# Patient Record
Sex: Male | Born: 1950 | Race: White | Hispanic: No | Marital: Married | Smoking: Never smoker
Health system: Southern US, Community
[De-identification: ages and names within clinical notes are randomized; demographics above are authoritative.]

## PROBLEM LIST (undated history)

## (undated) DIAGNOSIS — G4733 Obstructive sleep apnea (adult) (pediatric): Secondary | ICD-10-CM

## (undated) DIAGNOSIS — E8801 Alpha-1-antitrypsin deficiency: Secondary | ICD-10-CM

## (undated) DIAGNOSIS — J449 Chronic obstructive pulmonary disease, unspecified: Secondary | ICD-10-CM

## (undated) DIAGNOSIS — E781 Pure hyperglyceridemia: Secondary | ICD-10-CM

## (undated) DIAGNOSIS — K219 Gastro-esophageal reflux disease without esophagitis: Secondary | ICD-10-CM

## (undated) DIAGNOSIS — E039 Hypothyroidism, unspecified: Secondary | ICD-10-CM

## (undated) HISTORY — DX: Chronic obstructive pulmonary disease, unspecified: J44.9

## (undated) HISTORY — DX: Obstructive sleep apnea (adult) (pediatric): G47.33

## (undated) HISTORY — DX: Pure hyperglyceridemia: E78.1

## (undated) HISTORY — DX: Alpha-1-antitrypsin deficiency: E88.01

## (undated) HISTORY — DX: Gastro-esophageal reflux disease without esophagitis: K21.9

## (undated) HISTORY — DX: Hypothyroidism, unspecified: E03.9

---

## 2013-03-12 ENCOUNTER — Ambulatory Visit: Payer: Self-pay | Admitting: Gastroenterology

## 2016-03-01 DEATH — deceased

## 2016-03-20 ENCOUNTER — Other Ambulatory Visit: Payer: Self-pay | Admitting: Internal Medicine

## 2016-03-20 DIAGNOSIS — K921 Melena: Secondary | ICD-10-CM

## 2016-03-20 DIAGNOSIS — R197 Diarrhea, unspecified: Secondary | ICD-10-CM

## 2016-03-28 ENCOUNTER — Ambulatory Visit
Admission: RE | Admit: 2016-03-28 | Discharge: 2016-03-28 | Disposition: A | Discharge status: Home / Self Care | Payer: Commercial Managed Care - HMO | Source: Ambulatory Visit | Attending: Internal Medicine | Admitting: Internal Medicine

## 2016-03-28 DIAGNOSIS — R932 Abnormal findings on diagnostic imaging of liver and biliary tract: Secondary | ICD-10-CM | POA: Insufficient documentation

## 2016-03-28 DIAGNOSIS — K5730 Diverticulosis of large intestine without perforation or abscess without bleeding: Secondary | ICD-10-CM | POA: Insufficient documentation

## 2016-03-28 DIAGNOSIS — R197 Diarrhea, unspecified: Secondary | ICD-10-CM | POA: Diagnosis present

## 2016-03-28 DIAGNOSIS — K921 Melena: Secondary | ICD-10-CM

## 2016-03-28 DIAGNOSIS — M899 Disorder of bone, unspecified: Secondary | ICD-10-CM | POA: Diagnosis not present

## 2016-03-28 MED ORDER — IOPAMIDOL (ISOVUE-300) INJECTION 61%
100.0000 mL | Freq: Once | INTRAVENOUS | Status: AC | PRN
  Administered 2016-03-28: 100 mL via INTRAVENOUS

## 2017-08-21 ENCOUNTER — Other Ambulatory Visit: Payer: Self-pay | Admitting: Surgery

## 2017-08-21 DIAGNOSIS — M7521 Bicipital tendinitis, right shoulder: Secondary | ICD-10-CM

## 2017-08-29 ENCOUNTER — Ambulatory Visit
Admission: RE | Admit: 2017-08-29 | Discharge: 2017-08-29 | Disposition: A | Discharge status: Home / Self Care | Payer: Medicare Other | Source: Ambulatory Visit | Attending: Surgery | Admitting: Surgery

## 2017-08-29 DIAGNOSIS — M7551 Bursitis of right shoulder: Secondary | ICD-10-CM | POA: Diagnosis not present

## 2017-08-29 DIAGNOSIS — M19011 Primary osteoarthritis, right shoulder: Secondary | ICD-10-CM | POA: Diagnosis not present

## 2017-08-29 DIAGNOSIS — M7521 Bicipital tendinitis, right shoulder: Secondary | ICD-10-CM | POA: Diagnosis present

## 2018-10-12 IMAGING — MR MR SHOULDER*R* W/O CM
5 series · 40 of 40 positions shown · non-contrast
Comparison: None.

CLINICAL DATA: Chronic right shoulder pain (for 14 years) with
occasional clicking and popping. Worsens with repetitive motion. No
acute injury or prior relevant surgery.

EXAM:
MRI OF THE RIGHT SHOULDER WITHOUT CONTRAST
TECHNIQUE: Multiplanar, multisequence MR imaging of the shoulder was performed.
No intravenous contrast was administered.

[Series 3: T2 fat-sat · axial · 4.0mm · 0.59mm/px · z∈[-38,+59]mm · 8 of 23 slices shown (1 of 3)]
[im 1/23]
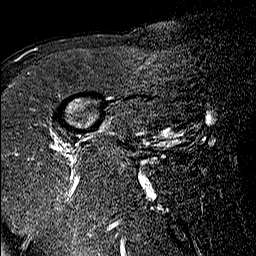
[im 4/23]
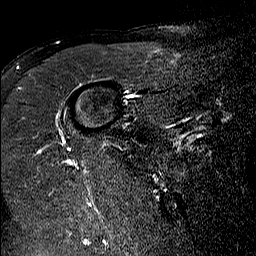
[im 7/23]
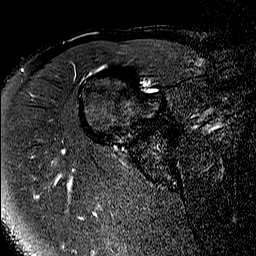
[im 10/23]
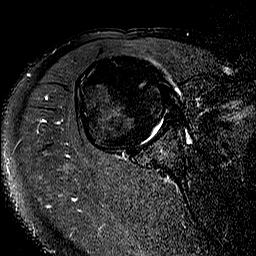
[im 13/23]
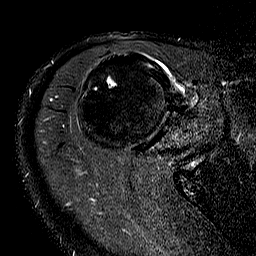
[im 16/23]
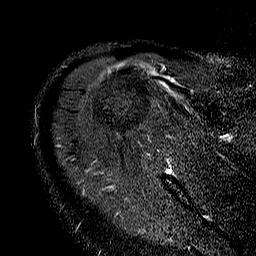
[im 19/23]
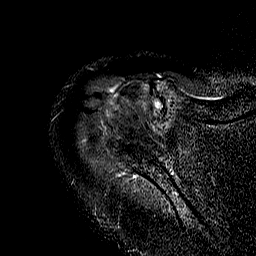
[im 23/23]
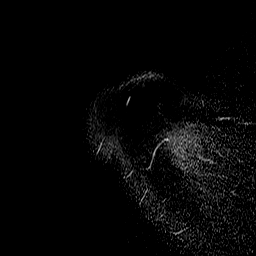

[Series 4: T2 fat-sat · oblique · 4.0mm · 0.59mm/px · 8 of 21 slices shown (2 of 3)]
[im 1/21]
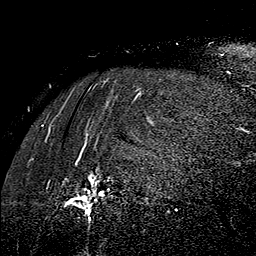
[im 3/21]
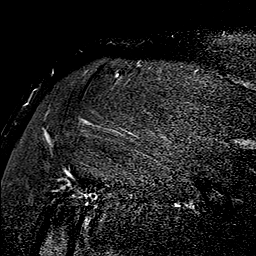
[im 6/21]
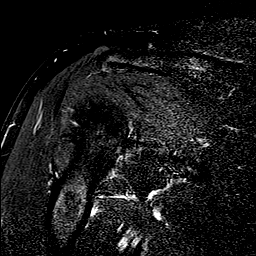
[im 9/21]
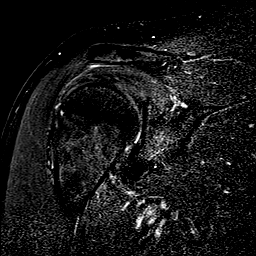
[im 12/21]
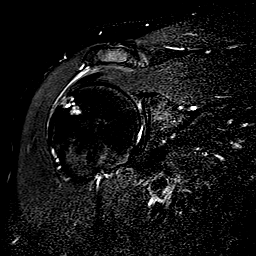
[im 15/21]
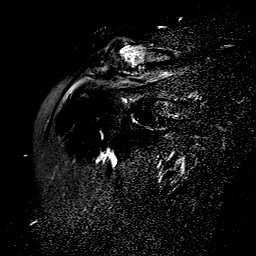
[im 18/21]
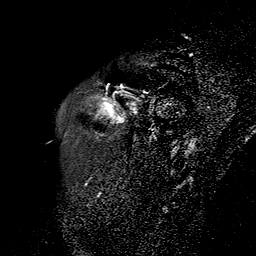
[im 21/21]
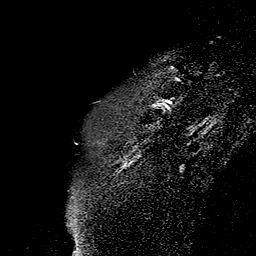

[Series 5: PD · oblique · 4.0mm · 0.59mm/px · 8 of 21 slices shown]
[im 1/21]
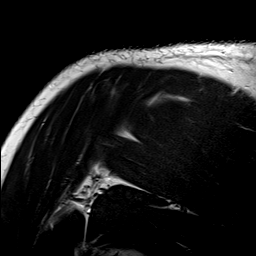
[im 3/21]
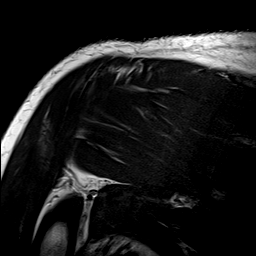
[im 6/21]
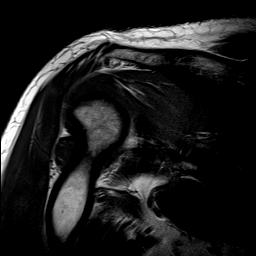
[im 9/21]
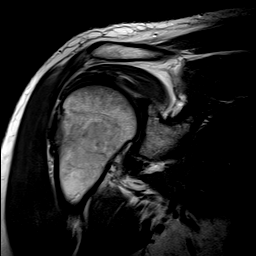
[im 12/21]
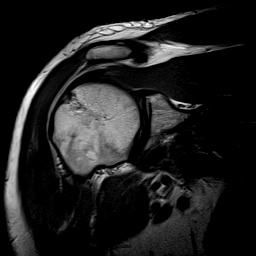
[im 15/21]
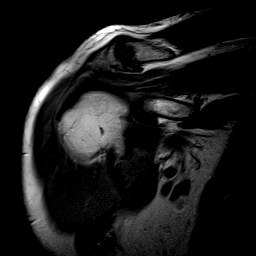
[im 18/21]
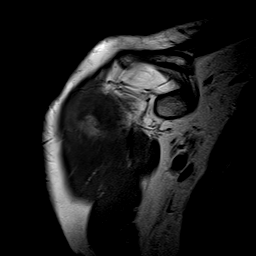
[im 21/21]
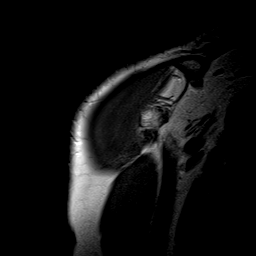

[Series 6: T1 · oblique · 4.0mm · 0.59mm/px · 8 of 21 slices shown]
[im 1/21]
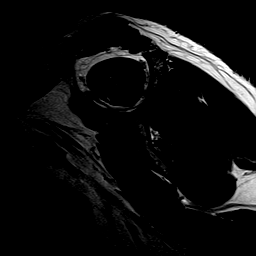
[im 3/21]
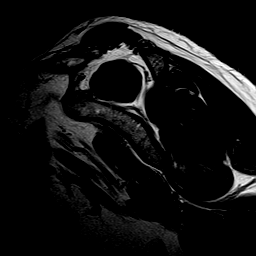
[im 6/21]
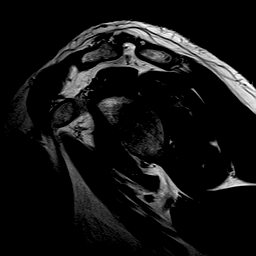
[im 9/21]
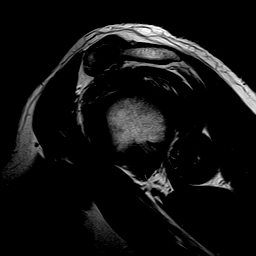
[im 12/21]
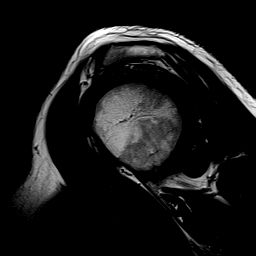
[im 15/21]
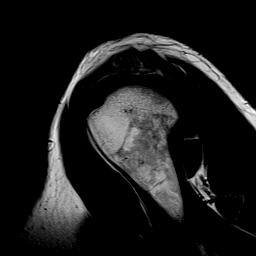
[im 18/21]
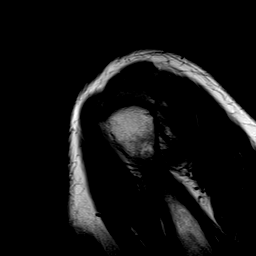
[im 21/21]
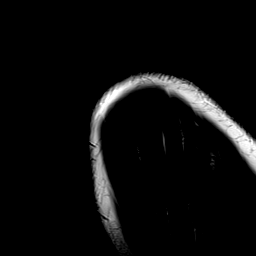

[Series 7: T2 fat-sat · oblique · 4.0mm · 0.59mm/px · 8 of 21 slices shown (3 of 3)]
[im 1/21]
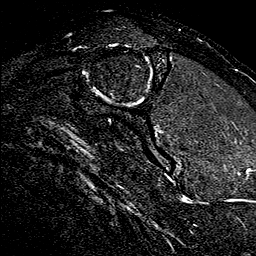
[im 3/21]
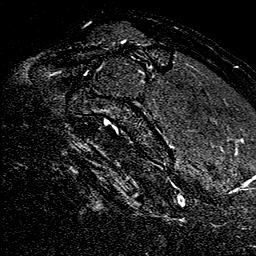
[im 6/21]
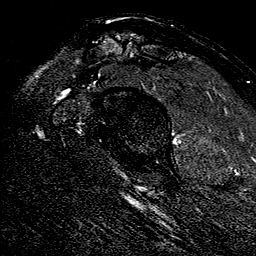
[im 9/21]
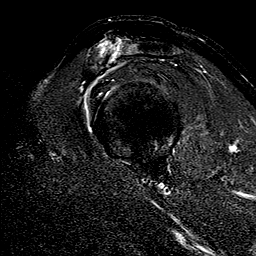
[im 12/21]
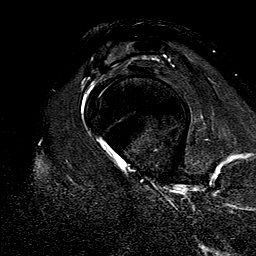
[im 15/21]
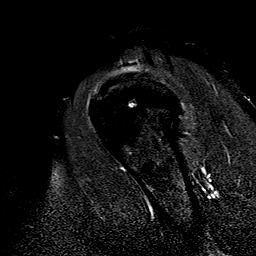
[im 18/21]
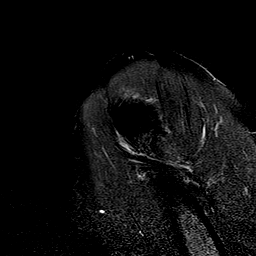
[im 21/21]
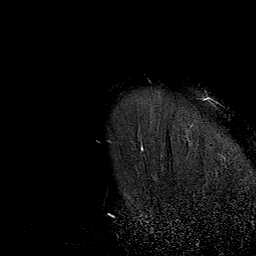

[40 of 40 positions shown; findings below may reference images not displayed]

FINDINGS: Rotator cuff: The shoulder is internally rotated, limiting
evaluation of the subscapularis and biceps tendons. Probable mild
supraspinatus and subscapularis tendinosis. The infraspinatus and
teres minor tendons appear normal. No evidence of rotator cuff tear.

Muscles:  No focal muscular atrophy or edema.

Biceps long head:  Intact and normally positioned.

Acromioclavicular Joint: The acromion is type 2. There are moderate
acromioclavicular degenerative changes. A small amount of fluid is
present anteriorly in the subacromial-subdeltoid bursa.

Glenohumeral Joint: No significant shoulder joint effusion or
glenohumeral arthropathy. Possible mild joint capsular thickening in
the axillary recess without edema.

Labrum: Labral evaluation limited by the lack of joint fluid. No
labral tear or paralabral cyst identified.

Bones: No acute or significant extra-articular osseous findings.
Mild subcortical cyst formation in the greater tuberosity.

Other: No significant soft tissue findings.
IMPRESSION: 1. Mild supraspinatus and subscapularis tendinosis with mild
subacromial-subdeltoid bursitis. No evidence of rotator cuff tear.
2. The labrum and biceps tendon appear intact.
3. Moderate acromioclavicular degenerative changes.
4. Possible mild joint capsular thickening in the axillary recess as
can be seen with adhesive capsulitis.

## 2021-06-20 ENCOUNTER — Encounter: Payer: Self-pay | Admitting: Urology

## 2021-06-20 ENCOUNTER — Other Ambulatory Visit: Payer: Self-pay

## 2021-06-20 ENCOUNTER — Ambulatory Visit (INDEPENDENT_AMBULATORY_CARE_PROVIDER_SITE_OTHER): Payer: Medicare Other | Admitting: Urology

## 2021-06-20 VITALS — BP 119/80 | HR 83 | Ht 72.0 in | Wt 201.0 lb

## 2021-06-20 DIAGNOSIS — R972 Elevated prostate specific antigen [PSA]: Secondary | ICD-10-CM

## 2021-06-20 DIAGNOSIS — N401 Enlarged prostate with lower urinary tract symptoms: Secondary | ICD-10-CM | POA: Diagnosis not present

## 2021-06-20 NOTE — Progress Notes (Signed)
° °  06/20/2021 5:01 PM   Ronnie Frank 1950-08-20 [id number]  Referring provider: Barbette Reichmann, MD [address] [facility] [place],  [place] [address]  Chief Complaint  Patient presents with   Elevated PSA    HPI: Ronnie Frank is a 70 y.o. male referred for evaluation of an elevated PSA.  Baseline PSA in the low-mid 2 range PSA 11/2020 was 2.82 Repeated December 2022 and was 2.97 Mild to moderate lower urinary tract symptoms primarily urinary hesitancy and weak stream at first morning void No dysuria, gross hematuria No flank, abdominal or pelvic pain Father diagnosed with prostate cancer age 5 and did not require treatment   PMH: Alpha-1-antitrypsin deficiency (HCC)     COPD (chronic obstructive pulmonary disease) (HCC)     GERD (gastroesophageal reflux disease)     Hypertriglyceridemia     Hypothyroidism     OSA (obstructive sleep apnea)       Surgical History:  CHOLECYSTECTOMY 2009   COLON SURGERY 1998  Fistula   COLONOSCOPY 03/2013  divertic sigmoid colon   FRACTURE SURGERY 1960  Left wrist,right clavicle,nose   skin cancer removal 05/2015  on nose   TREATMENT FISTULA ANAL   Home Medications:  Allergies as of 06/20/2021   No Known Allergies      Medication List        Accurate as of June 20, 2021 11:59 PM. If you have any questions, ask your nurse or doctor.          Cholecalciferol 50 MCG (2000 UT) Tabs Take by mouth.   Eye Vitamins Caps Take by mouth.   fluticasone-salmeterol 250-50 MCG/ACT Aepb Commonly known as: ADVAIR Inhale into the lungs.   omeprazole 20 MG capsule Commonly known as: PRILOSEC Take by mouth.        Allergies: No Known Allergies  Family History: No family history on file.  Social History:  reports that he has never smoked. He has never used smokeless tobacco. He reports current alcohol use. No history on file for drug use.   Physical Exam: BP 119/80    Pulse 83    Ht 6' (1.829  m)    Wt 201 lb (91.2 kg)    BMI 27.26 kg/m   Constitutional:  Alert and oriented, No acute distress. HEENT: [place] AT, moist mucus membranes.  Trachea midline, no masses. Cardiovascular: No clubbing, cyanosis, or edema. Respiratory: Normal respiratory effort, no increased work of breathing. GU: Prostate 60 g, smooth without nodules Psychiatric: Normal mood and affect.    Assessment & Plan:    1.  BPH with LUTS Voiding symptoms are not bothersome enough that he desires medical management  2.  Prostate cancer screening Benign DRE PSA is not elevated. We discussed concept of PSA velocity and rise of 0.75/year is not considered abnormal.  The increase from June 2021-June 2022 is within that 0.75. He is scheduled for a follow-up PSA with Dr. Marcello Fennel June 2023 Will schedule follow-up appointment July 2023 for recheck of voiding symptoms and review of that PSA   Riki Altes, MD  [facility] [address] [place], [place] [address] [phone]

## 2021-06-25 ENCOUNTER — Encounter: Payer: Self-pay | Admitting: Urology

## 2022-01-17 ENCOUNTER — Ambulatory Visit (INDEPENDENT_AMBULATORY_CARE_PROVIDER_SITE_OTHER): Payer: Medicare Other | Admitting: Urology

## 2022-01-17 ENCOUNTER — Encounter: Payer: Self-pay | Admitting: Urology

## 2022-01-17 VITALS — BP 144/76 | HR 89 | Ht 72.0 in | Wt 200.0 lb

## 2022-01-17 DIAGNOSIS — N401 Enlarged prostate with lower urinary tract symptoms: Secondary | ICD-10-CM

## 2022-01-18 ENCOUNTER — Ambulatory Visit: Payer: No Typology Code available for payment source | Admitting: Urology

## 2022-01-18 ENCOUNTER — Encounter: Payer: Self-pay | Admitting: Urology

## 2022-01-18 NOTE — Progress Notes (Signed)
   01/17/2022 7:33 AM   Ronnie Frank 1951/05/08 [id number]  Referring provider: Barbette Reichmann, MD [address] [facility] [place],  [place] [address]  Chief Complaint  Patient presents with   Elevated PSA   Urologic history: 1.  BPH with LUTS Mild lower urinary tract symptoms   HPI: 71 y.o. male presents for 9-month follow-up  Initially referred December 2022 for rising PSA though the PSA velocity was not abnormal Last PSA December 22 2 was 2.97 PSA repeated [facility] 12/03/2021 and was stable at 2.67 Previous benign DRE   PMH: Past Medical History:  Diagnosis Date   Alpha-1-antitrypsin deficiency (HCC)    COPD (chronic obstructive pulmonary disease) (HCC)    GERD (gastroesophageal reflux disease)    Hypertriglyceridemia    Hypothyroidism    OSA (obstructive sleep apnea)     Surgical History: No past surgical history on file.  Home Medications:  Allergies as of 01/17/2022   No Known Allergies      Medication List        Accurate as of January 17, 2022 11:59 PM. If you have any questions, ask your nurse or doctor.          Cholecalciferol 50 MCG (2000 UT) Tabs Take by mouth.   Eye Vitamins Caps Take by mouth.   fluticasone-salmeterol 250-50 MCG/ACT Aepb Commonly known as: ADVAIR Inhale into the lungs.   omeprazole 20 MG capsule Commonly known as: PRILOSEC Take by mouth.        Allergies: No Known Allergies  Family History: No family history on file.  Social History:  reports that he has never smoked. He has never used smokeless tobacco. He reports current alcohol use. No history on file for drug use.   Physical Exam: BP (!) 144/76   Pulse 89   Ht 6' (1.829 m)   Wt 200 lb (90.7 kg)   BMI 27.12 kg/m   Constitutional:  Alert and oriented, No acute distress. HEENT: [place] AT Respiratory: Normal respiratory effort, no increased work of breathing. Psychiatric: Normal mood and affect.   Assessment & Plan:    1.   BPH with LUTS Mild lower urinary tract symptoms PSA velocity not abnormal He will continue annual PSA with Dr. Marcello Fennel and will see back for PSA >0.75 rise on 3 successive readings   Riki Altes, MD  [facility] [address] [place], [place] [address] [phone]

## 2022-11-06 ENCOUNTER — Emergency Department: Payer: Medicare Other

## 2022-11-06 ENCOUNTER — Other Ambulatory Visit: Payer: Self-pay

## 2022-11-06 ENCOUNTER — Emergency Department
Admission: EM | Admit: 2022-11-06 | Discharge: 2022-11-06 | Disposition: A | Discharge status: Home / Self Care | Payer: Medicare Other | Attending: Emergency Medicine | Admitting: Emergency Medicine

## 2022-11-06 ENCOUNTER — Encounter: Payer: Self-pay | Admitting: Emergency Medicine

## 2022-11-06 DIAGNOSIS — R000 Tachycardia, unspecified: Secondary | ICD-10-CM | POA: Diagnosis not present

## 2022-11-06 DIAGNOSIS — J449 Chronic obstructive pulmonary disease, unspecified: Secondary | ICD-10-CM | POA: Insufficient documentation

## 2022-11-06 DIAGNOSIS — E039 Hypothyroidism, unspecified: Secondary | ICD-10-CM | POA: Diagnosis not present

## 2022-11-06 DIAGNOSIS — R002 Palpitations: Secondary | ICD-10-CM | POA: Diagnosis not present

## 2022-11-06 LAB — CBC
HCT: 46.4 % (ref 39.0–52.0)
Hemoglobin: 16.3 g/dL (ref 13.0–17.0)
MCH: 30.6 pg (ref 26.0–34.0)
MCHC: 35.1 g/dL (ref 30.0–36.0)
MCV: 87.1 fL (ref 80.0–100.0)
Platelets: 241 10*3/uL (ref 150–400)
RBC: 5.33 MIL/uL (ref 4.22–5.81)
RDW: 12.1 % (ref 11.5–15.5)
WBC: 8.3 10*3/uL (ref 4.0–10.5)
nRBC: 0 % (ref 0.0–0.2)

## 2022-11-06 LAB — BASIC METABOLIC PANEL
Anion gap: 9 (ref 5–15)
BUN: 20 mg/dL (ref 8–23)
CO2: 24 mmol/L (ref 22–32)
Calcium: 9.6 mg/dL (ref 8.9–10.3)
Chloride: 108 mmol/L (ref 98–111)
Creatinine, Ser: 1.37 mg/dL — ABNORMAL HIGH (ref 0.61–1.24)
GFR, Estimated: 55 mL/min — ABNORMAL LOW (ref >60)
Glucose, Bld: 94 mg/dL (ref 70–99)
Potassium: 3.7 mmol/L (ref 3.5–5.1)
Sodium: 141 mmol/L (ref 135–145)

## 2022-11-06 LAB — TROPONIN I (HIGH SENSITIVITY)
Troponin I (High Sensitivity): 4 ng/L (ref <18)
Troponin I (High Sensitivity): 4 ng/L (ref <18)

## 2022-11-06 NOTE — ED Triage Notes (Signed)
Pt to ED via POV. Pt states that he was playing golf today and his heart rate was in the 130's. Pt states that this is not normal for him. Pt states that he resting heart rate was elevated today as well, Pt current heart rate is WNL. PT states that he did have some chest discomfort when his heart rate was elevated but is not having pain now.

## 2022-11-06 NOTE — ED Provider Notes (Signed)
[facility] Provider Note    Event Date/Time   First MD Initiated Contact with Patient 11/06/22 1633     (approximate)   History   Chief Complaint Tachycardia   HPI  Ronnie Frank is a 72 y.o. male with past medical history of hyperlipidemia, COPD, hypothyroidism, and alpha-1 antitrypsin deficiency who presents to the ED complaining of palpitations.  Patient reports that he was playing golf today, which she does about 3 days a week, and noticed that his heart rate was faster than usual.  He states that it seemed to stay around to the 130s even when he was at rest.  He denies significant exertion when the symptoms first started, did feel slightly short of breath with some heaviness in his chest.  When he returned home, he continued to feel like his heart rate was faster than usual, found it to be around 100.  Symptoms gradually improved and he is now asymptomatic with no chest pain or shortness of breath ongoing.  He denies any history of similar symptoms, has otherwise felt well recently with no fevers, cough, nausea, vomiting, or diarrhea.  He denies significant cardiac history.     Physical Exam   Triage Vital Signs: ED Triage Vitals  Enc Vitals Group     BP 11/06/22 1438 (!) 135/93     Pulse Rate 11/06/22 1438 90     Resp 11/06/22 1438 16     Temp 11/06/22 1437 98.5 F (36.9 C)     Temp src --      SpO2 11/06/22 1438 96 %     Weight 11/06/22 1438 205 lb (93 kg)     Height 11/06/22 1438 6' (1.829 m)     Head Circumference --      Peak Flow --      Pain Score 11/06/22 1438 0     Pain Loc --      Pain Edu? --      Excl. in GC? --     Most recent vital signs: Vitals:   11/06/22 1438 11/06/22 1849  BP: (!) 135/93 129/81  Pulse: 90 68  Resp: 16 16  Temp:    SpO2: 96% 97%    Constitutional: Alert and oriented. Eyes: Conjunctivae are normal. Head: Atraumatic. Nose: No congestion/rhinnorhea. Mouth/Throat: Mucous membranes are moist.   Cardiovascular: Normal rate, regular rhythm. Grossly normal heart sounds.  2+ radial pulses bilaterally. Respiratory: Normal respiratory effort.  No retractions. Lungs CTAB. Gastrointestinal: Soft and nontender. No distention. Musculoskeletal: No lower extremity tenderness nor edema.  Neurologic:  Normal speech and language. No gross focal neurologic deficits are appreciated.    ED Results / Procedures / Treatments   Labs (all labs ordered are listed, but only abnormal results are displayed) Labs Reviewed  BASIC METABOLIC PANEL - Abnormal; Notable for the following components:      Result Value   Creatinine, Ser 1.37 (*)    GFR, Estimated 55 (*)    All other components within normal limits  CBC  TROPONIN I (HIGH SENSITIVITY)  TROPONIN I (HIGH SENSITIVITY)     EKG  ED ECG REPORT I, [name], the attending physician, personally viewed and interpreted this ECG.   Date: 11/06/2022  EKG Time: 14:33  Rate: 85  Rhythm: normal sinus rhythm  Axis: Normal  Intervals:none  ST&T Change: None  RADIOLOGY Chest x-ray reviewed and interpreted by me with no infiltrate, edema, or effusion.  PROCEDURES:  Critical Care performed: No  Procedures  MEDICATIONS ORDERED IN ED: Medications - No data to display   IMPRESSION / MDM / ASSESSMENT AND PLAN / ED COURSE  I reviewed the triage vital signs and the nursing notes.                              72 y.o. male with past medical history of hyperlipidemia, COPD, hypothyroidism, and alpha 1 antitrypsin deficiency who presents to the ED complaining of palpitations and elevated heart rate while playing golf earlier today.  Patient's presentation is most consistent with acute presentation with potential threat to life or bodily function.  Differential diagnosis includes, but is not limited to, arrhythmia, ACS, dehydration, electrolyte abnormality, AKI, anemia.  Patient nontoxic-appearing and in no acute distress, vital signs  are unremarkable and he is asymptomatic here in the ED.  EKG shows normal sinus rhythm with no ischemic changes.  Labs are reassuring without significant anemia, leukocytosis, or electrolyte abnormality.  Patient does have mild renal impairment, although this appears similar to previous.  Chest x-ray is unremarkable and troponin within normal limits.  We will observe on cardiac monitor and check second set troponin.  If this is unremarkable, patient would be appropriate for discharge home with outpatient cardiology follow-up.  No events noted on cardiac monitor, repeat troponin within normal limits.  Patient remains asymptomatic here in the ED and is appropriate for discharge home with follow-up with his cardiologist.  He was counseled to return to the ED for new or worsening symptoms, counseled to avoid strenuous activity until cleared by cardiology.  Patient agrees with plan.      FINAL CLINICAL IMPRESSION(S) / ED DIAGNOSES   Final diagnoses:  Palpitations  Tachycardia     Rx / DC Orders   ED Discharge Orders          Ordered    Ambulatory referral to Cardiology        11/06/22 1845             Note:  This document was prepared using Dragon voice recognition software and may include unintentional dictation errors.   [name], MD 11/06/22 [phone]

## 2022-11-12 ENCOUNTER — Other Ambulatory Visit: Payer: Self-pay | Admitting: Internal Medicine

## 2022-11-12 DIAGNOSIS — R000 Tachycardia, unspecified: Secondary | ICD-10-CM

## 2022-11-12 DIAGNOSIS — K219 Gastro-esophageal reflux disease without esophagitis: Secondary | ICD-10-CM

## 2022-11-12 DIAGNOSIS — R0602 Shortness of breath: Secondary | ICD-10-CM

## 2022-11-12 DIAGNOSIS — E781 Pure hyperglyceridemia: Secondary | ICD-10-CM

## 2022-11-12 DIAGNOSIS — R079 Chest pain, unspecified: Secondary | ICD-10-CM

## 2022-11-12 DIAGNOSIS — E785 Hyperlipidemia, unspecified: Secondary | ICD-10-CM

## 2022-11-12 DIAGNOSIS — R0789 Other chest pain: Secondary | ICD-10-CM

## 2022-11-12 DIAGNOSIS — R009 Unspecified abnormalities of heart beat: Secondary | ICD-10-CM

## 2022-11-12 DIAGNOSIS — F419 Anxiety disorder, unspecified: Secondary | ICD-10-CM

## 2022-11-12 DIAGNOSIS — R42 Dizziness and giddiness: Secondary | ICD-10-CM

## 2022-11-12 DIAGNOSIS — I4891 Unspecified atrial fibrillation: Secondary | ICD-10-CM

## 2022-11-12 DIAGNOSIS — G4733 Obstructive sleep apnea (adult) (pediatric): Secondary | ICD-10-CM

## 2022-11-12 DIAGNOSIS — E8801 Alpha-1-antitrypsin deficiency: Secondary | ICD-10-CM

## 2022-11-12 DIAGNOSIS — J441 Chronic obstructive pulmonary disease with (acute) exacerbation: Secondary | ICD-10-CM

## 2022-11-12 DIAGNOSIS — E039 Hypothyroidism, unspecified: Secondary | ICD-10-CM

## 2022-11-12 DIAGNOSIS — R002 Palpitations: Secondary | ICD-10-CM

## 2022-11-22 ENCOUNTER — Other Ambulatory Visit: Payer: Self-pay

## 2022-11-22 DIAGNOSIS — R109 Unspecified abdominal pain: Secondary | ICD-10-CM

## 2022-11-22 DIAGNOSIS — R10A1 Flank pain, right side: Secondary | ICD-10-CM

## 2022-11-22 DIAGNOSIS — N289 Disorder of kidney and ureter, unspecified: Secondary | ICD-10-CM

## 2022-11-28 ENCOUNTER — Ambulatory Visit
Admission: RE | Admit: 2022-11-28 | Discharge: 2022-11-28 | Disposition: A | Discharge status: Home / Self Care | Payer: Medicare Other | Source: Ambulatory Visit | Attending: Internal Medicine | Admitting: Internal Medicine

## 2022-11-28 DIAGNOSIS — N289 Disorder of kidney and ureter, unspecified: Secondary | ICD-10-CM | POA: Diagnosis not present

## 2022-11-28 DIAGNOSIS — R109 Unspecified abdominal pain: Secondary | ICD-10-CM | POA: Insufficient documentation

## 2022-12-03 ENCOUNTER — Encounter (HOSPITAL_COMMUNITY): Payer: Self-pay

## 2022-12-03 ENCOUNTER — Other Ambulatory Visit (HOSPITAL_COMMUNITY): Payer: Self-pay | Admitting: *Deleted

## 2022-12-03 MED ORDER — METOPROLOL TARTRATE 100 MG PO TABS: ORAL_TABLET | ORAL | 0 refills | Status: AC

## 2022-12-04 ENCOUNTER — Telehealth (HOSPITAL_COMMUNITY): Payer: Self-pay | Admitting: *Deleted

## 2022-12-04 NOTE — Telephone Encounter (Signed)
Reaching out to patient to offer assistance regarding upcoming cardiac imaging study; pt verbalizes understanding of appt date/time, parking situation and where to check in, pre-test NPO status and medications ordered, and verified current allergies; name and call back number provided for further questions should they arise  Gurinder Toral RN Navigator Cardiac Imaging [facility] [phone] office [phone] cell  Patient to take 100mg metoprolol tartrate two hours prior to his cardiac CT scan. 

## 2022-12-04 NOTE — Telephone Encounter (Signed)
Attempted to call patient regarding upcoming cardiac CT appointment. °Left message on voicemail with name and callback number ° °Abelina Ketron RN Navigator Cardiac Imaging °[facility] °[phone] Office °[phone] Cell ° °

## 2022-12-05 ENCOUNTER — Ambulatory Visit
Admission: RE | Admit: 2022-12-05 | Discharge: 2022-12-05 | Disposition: A | Discharge status: Home / Self Care | Payer: Medicare Other | Source: Ambulatory Visit | Attending: Internal Medicine | Admitting: Internal Medicine

## 2022-12-05 DIAGNOSIS — E785 Hyperlipidemia, unspecified: Secondary | ICD-10-CM | POA: Diagnosis present

## 2022-12-05 DIAGNOSIS — R009 Unspecified abnormalities of heart beat: Secondary | ICD-10-CM | POA: Insufficient documentation

## 2022-12-05 DIAGNOSIS — I4891 Unspecified atrial fibrillation: Secondary | ICD-10-CM | POA: Insufficient documentation

## 2022-12-05 DIAGNOSIS — R030 Elevated blood-pressure reading, without diagnosis of hypertension: Secondary | ICD-10-CM | POA: Diagnosis present

## 2022-12-05 DIAGNOSIS — R0602 Shortness of breath: Secondary | ICD-10-CM | POA: Insufficient documentation

## 2022-12-05 DIAGNOSIS — R072 Precordial pain: Secondary | ICD-10-CM | POA: Diagnosis not present

## 2022-12-05 DIAGNOSIS — R0789 Other chest pain: Secondary | ICD-10-CM | POA: Insufficient documentation

## 2022-12-05 MED ORDER — NITROGLYCERIN 0.4 MG SL SUBL
0.8000 mg | SUBLINGUAL_TABLET | Freq: Once | SUBLINGUAL | Status: AC
  Administered 2022-12-05: 0.8 mg via SUBLINGUAL

## 2022-12-05 MED ORDER — IOHEXOL 350 MG/ML SOLN
75.0000 mL | Freq: Once | INTRAVENOUS | Status: AC | PRN
  Administered 2022-12-05: 75 mL via INTRAVENOUS

## 2022-12-05 NOTE — Progress Notes (Signed)
Patient tolerated CT well. Drank water after. Vital signs stable encourage to drink water throughout day.Reasons explained and verbalized understanding. Ambulated steady gait.  

## 2022-12-19 ENCOUNTER — Inpatient Hospital Stay: Admission: RE | Admit: 2022-12-19 | Payer: Medicare Other | Source: Ambulatory Visit

## 2023-05-22 ENCOUNTER — Other Ambulatory Visit: Payer: Self-pay | Admitting: Family Medicine

## 2023-05-22 DIAGNOSIS — G540 Brachial plexus disorders: Secondary | ICD-10-CM

## 2023-05-22 DIAGNOSIS — M7521 Bicipital tendinitis, right shoulder: Secondary | ICD-10-CM

## 2023-05-22 DIAGNOSIS — M7581 Other shoulder lesions, right shoulder: Secondary | ICD-10-CM

## 2023-05-22 DIAGNOSIS — M899 Disorder of bone, unspecified: Secondary | ICD-10-CM

## 2023-05-26 ENCOUNTER — Ambulatory Visit
Admission: RE | Admit: 2023-05-26 | Discharge: 2023-05-26 | Disposition: A | Discharge status: Home / Self Care | Payer: Medicare Other | Source: Ambulatory Visit | Attending: Family Medicine | Admitting: Family Medicine

## 2023-05-26 DIAGNOSIS — M7521 Bicipital tendinitis, right shoulder: Secondary | ICD-10-CM | POA: Insufficient documentation

## 2023-05-26 DIAGNOSIS — G540 Brachial plexus disorders: Secondary | ICD-10-CM | POA: Diagnosis present

## 2023-05-26 DIAGNOSIS — M7581 Other shoulder lesions, right shoulder: Secondary | ICD-10-CM | POA: Insufficient documentation

## 2023-05-26 DIAGNOSIS — M899 Disorder of bone, unspecified: Secondary | ICD-10-CM | POA: Insufficient documentation

## 2023-07-09 ENCOUNTER — Ambulatory Visit: Payer: Medicare Other

## 2023-07-09 DIAGNOSIS — K5730 Diverticulosis of large intestine without perforation or abscess without bleeding: Secondary | ICD-10-CM | POA: Diagnosis not present

## 2023-07-09 DIAGNOSIS — Z1211 Encounter for screening for malignant neoplasm of colon: Secondary | ICD-10-CM | POA: Diagnosis present

## 2023-07-09 DIAGNOSIS — K621 Rectal polyp: Secondary | ICD-10-CM | POA: Diagnosis not present

## 2023-07-09 DIAGNOSIS — K640 First degree hemorrhoids: Secondary | ICD-10-CM | POA: Diagnosis not present

## 2023-07-09 DIAGNOSIS — D122 Benign neoplasm of ascending colon: Secondary | ICD-10-CM | POA: Diagnosis not present
